# Patient Record
Sex: Female | Born: 1955 | Race: Black or African American | Hispanic: No | Marital: Single | Smoking: Former smoker
Health system: Southern US, Community
[De-identification: ages and names within clinical notes are randomized; demographics above are authoritative.]

## PROBLEM LIST (undated history)

## (undated) DIAGNOSIS — H409 Unspecified glaucoma: Secondary | ICD-10-CM

## (undated) HISTORY — PX: HERNIA REPAIR: SHX51

## (undated) HISTORY — DX: Unspecified glaucoma: H40.9

---

## 2012-03-12 ENCOUNTER — Ambulatory Visit (INDEPENDENT_AMBULATORY_CARE_PROVIDER_SITE_OTHER): Payer: BC Managed Care – PPO | Admitting: Family Medicine

## 2012-03-12 ENCOUNTER — Ambulatory Visit: Payer: BC Managed Care – PPO

## 2012-03-12 VITALS — BP 124/88 | HR 68 | Temp 97.5°F | Resp 18 | Ht 65.0 in | Wt 181.0 lb

## 2012-03-12 DIAGNOSIS — Z23 Encounter for immunization: Secondary | ICD-10-CM

## 2012-03-12 DIAGNOSIS — Z111 Encounter for screening for respiratory tuberculosis: Secondary | ICD-10-CM

## 2012-03-12 NOTE — Progress Notes (Signed)
Subjective:    Patient ID: Jean West, female    DOB: September 15, 1955, 57 y.o.   MRN: [id number] Chief Complaint  Patient presents with  . Immunizations  . 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 Moved down from [place] for family.  UTD on tetanus - got about 3-4 yrs ago in [place].  Needs flu shot and tb test.  Did think she might have had the chicken pox when she was little but not sure. Has not been vaccinated against Hep A and B.     Tuberculosis Risk Questionnaire  1. Were you born outside the [place] in one of the following parts of the world:    [place], [place], [place], [place] or [place]?  No  2. Have you traveled outside the [place] and lived for more than one month in one of the following parts of the world:  [place], [place], [place], [place] or [place]?  No  3. Do you have a compromised immune system such as from any of the following conditions:  HIV/AIDS, organ or bone marrow transplantation, diabetes, immunosuppressive   medicines (e.g. Prednisone, Remicaide), leukemia, lymphoma, cancer of the   head or neck, gastrectomy or jejunal bypass, end-stage renal disease (on   dialysis), or silicosis?  No    4. Have you ever done one of the following:    Used crack cocaine, injected illegal drugs, worked or resided in jail or prison,   worked or resided at a homeless shelter, or worked as a Research scientist (physical sciences) in   direct contact with patients?  No  5. Have you ever been exposed to anyone with infectious tuberculosis?  No   Tuberculosis Symptom Questionnaire  Do you currently have any of the following symptoms?  1. Unexplained cough lasting more than 3 weeks? No  Unexplained fever lasting more than 3 weeks. No   3. Night Sweats (sweating that leaves the bedclothes and sheets wet)   No  4. Shortness of Breath No  5. Chest Pain No  6. Unintentional weight loss  No  7. Unexplained fatigue (very tired for no reason)  No  In 1977 she went for a lung screen and then was suddenly hospitalized from pulmonary effusion - thinks she given isoniazid for poss TB exposure for 9 mos after hospitalization. She never had any symptoms.  No problems since. Has had TB skin tests several times since but are always negative.  Past Medical History  Diagnosis Date  . Glaucoma    Current Outpatient Prescriptions on File Prior to Visit  Medication Sig Dispense Refill  . levothyroxine (SYNTHROID, LEVOTHROID) 100 MCG tablet Take 100 mcg by mouth daily.       Allergies  Allergen Reactions  . Levaquin (Levofloxacin In D5w)   . Metronidazole   . Zestril (Lisinopril)      Review of Systems  Constitutional: Negative for fever, chills, diaphoresis, activity change, appetite change, fatigue and unexpected weight change.  HENT: Negative for congestion, sore throat, neck pain, neck stiffness and sinus pressure.   Respiratory: Negative for cough and shortness of breath.   Cardiovascular: Negative for chest pain.      BP 124/88  Pulse 68  Temp 97.5 F (36.4 C) (Oral)  Resp 18  Ht 5\' 5"  (1.651 m)  Wt 181 lb (82.101 kg)  BMI 30.12 kg/m2  SpO2 97% Objective:   Physical Exam  Constitutional: She is oriented to person, place, and time. She appears  well-developed and well-nourished. No distress.  HENT:  Head: Normocephalic and atraumatic.  Right Ear: External ear normal.  Left Ear: External ear normal.  Eyes: Conjunctivae normal are normal. No scleral icterus.  Neck: Normal range of motion. Neck supple. No thyromegaly present.  Cardiovascular: Normal rate, regular rhythm, normal heart sounds and intact distal pulses.   Pulmonary/Chest: Effort normal and breath sounds normal. No respiratory distress.  Musculoskeletal: She exhibits no edema.  Lymphadenopathy:    She has no cervical adenopathy.  Neurological: She is alert and oriented to person, place, and time.  Skin: Skin is warm and dry. She is not diaphoretic. No  erythema.  Psychiatric: She has a normal mood and affect. Her behavior is normal.         [facility] reading (PRIMARY) by  Dr. Clelia Croft. No acute abnormlity seen  Assessment & Plan:   1. Need for prophylactic vaccination and inoculation against influenza  Flu vaccine greater than or equal to 3yo preservative free IM  2. Screening-pulmonary TB  DG Chest 1 View  Flu shot given and pt given a letter stating she has screened negative for TB between the negative questionnaire and CXR.  If she has a h/o being treated for latent TB, then she prob shouldn't have ppd placed - though reassuring that she has had many negative ones since then per pt. 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

## 2012-03-12 NOTE — Patient Instructions (Signed)
To Whom It May Concern:  This patient was screened for tuberculosis using current guidelines from the Centers for Disease Control, and does not have tuberculosis in the communicable form.  

## 2012-04-08 ENCOUNTER — Ambulatory Visit: Payer: Self-pay | Admitting: Internal Medicine

## 2013-04-16 ENCOUNTER — Ambulatory Visit (INDEPENDENT_AMBULATORY_CARE_PROVIDER_SITE_OTHER): Payer: BC Managed Care – PPO | Admitting: Family Medicine

## 2013-04-16 VITALS — BP 125/70 | HR 60 | Temp 98.1°F | Resp 16 | Ht 65.5 in | Wt 180.0 lb

## 2013-04-16 DIAGNOSIS — H409 Unspecified glaucoma: Secondary | ICD-10-CM | POA: Insufficient documentation

## 2013-04-16 DIAGNOSIS — Z13220 Encounter for screening for lipoid disorders: Secondary | ICD-10-CM

## 2013-04-16 DIAGNOSIS — E039 Hypothyroidism, unspecified: Secondary | ICD-10-CM | POA: Insufficient documentation

## 2013-04-16 LAB — LIPID PANEL
Cholesterol: 178 mg/dL (ref 0–200)
HDL: 56 mg/dL (ref >39)
LDL CALC: 105 mg/dL — AB (ref 0–99)
TRIGLYCERIDES: 85 mg/dL (ref <150)
Total CHOL/HDL Ratio: 3.2 Ratio
VLDL: 17 mg/dL (ref 0–40)

## 2013-04-16 LAB — COMPREHENSIVE METABOLIC PANEL
ALBUMIN: 4.2 g/dL (ref 3.5–5.2)
ALK PHOS: 58 U/L (ref 39–117)
ALT: 9 U/L (ref 0–35)
AST: 15 U/L (ref 0–37)
BUN: 11 mg/dL (ref 6–23)
CALCIUM: 9.9 mg/dL (ref 8.4–10.5)
CHLORIDE: 104 meq/L (ref 96–112)
CO2: 27 mEq/L (ref 19–32)
Creat: 0.71 mg/dL (ref 0.50–1.10)
Glucose, Bld: 87 mg/dL (ref 70–99)
POTASSIUM: 3.8 meq/L (ref 3.5–5.3)
SODIUM: 139 meq/L (ref 135–145)
TOTAL PROTEIN: 7.4 g/dL (ref 6.0–8.3)
Total Bilirubin: 0.5 mg/dL (ref 0.2–1.2)

## 2013-04-16 LAB — CBC
HEMATOCRIT: 38.3 % (ref 36.0–46.0)
Hemoglobin: 12.7 g/dL (ref 12.0–15.0)
MCH: 29.9 pg (ref 26.0–34.0)
MCHC: 33.2 g/dL (ref 30.0–36.0)
MCV: 90.1 fL (ref 78.0–100.0)
PLATELETS: 213 10*3/uL (ref 150–400)
RBC: 4.25 MIL/uL (ref 3.87–5.11)
RDW: 14.2 % (ref 11.5–15.5)
WBC: 6.8 10*3/uL (ref 4.0–10.5)

## 2013-04-16 MED ORDER — LEVOTHYROXINE SODIUM 100 MCG PO TABS: 100.0000 ug | ORAL_TABLET | Freq: Every day | ORAL | Status: AC

## 2013-04-16 NOTE — Patient Instructions (Signed)
I will be in touch with your labs when they come in - we will make sure we do not need to adjust your thyroid medication

## 2013-04-16 NOTE — Progress Notes (Signed)
[facility] [address], [place] [id number] [phone]  Date:  04/16/2013   Name:  Jean West   DOB:  11/07/1955   MRN:  [id number]  PCP:  No primary provider on file.    Chief Complaint: Medication Refill   History of Present Illness:  Jean West is a 58 y.o. very pleasant female patient who presents with the following:  Here today for a TSH check and thyroid medication refill- she did eat an apple very early this am but is otherwise fasting.   She would like to go ahead and get standard screening labs done today as well She is feeling well overall, has no other complaints today  There are no active problems to display for this patient.   Past Medical History  Diagnosis Date  . Glaucoma     Past Surgical History  Procedure Laterality Date  . Hernia repair      History  Substance Use Topics  . Smoking status: Former Smoker    Quit date: 02/06/1997  . Smokeless tobacco: Not on file  . Alcohol Use: No    No family history on file.  Allergies  Allergen Reactions  . Levaquin [Levofloxacin In D5w]   . Metronidazole   . Zestril [Lisinopril]     Medication list has been reviewed and updated.  Current Outpatient Prescriptions on File Prior to Visit  Medication Sig Dispense Refill  . brimonidine (ALPHAGAN P) 0.1 % SOLN 2 drops in both eyes daily      . dorzolamide (TRUSOPT) 2 % ophthalmic solution 1 drop 3 (three) times daily.      Marland Kitchen. levothyroxine (SYNTHROID, LEVOTHROID) 100 MCG tablet Take 100 mcg by mouth daily.      Marland Kitchen. latanoprost (XALATAN) 0.005 % ophthalmic solution 1 drop at bedtime.       No current facility-administered medications on file prior to visit.    Review of Systems:  As per HPI- otherwise negative.   Physical Examination: Filed Vitals:   04/16/13 1235  BP: 125/70  Pulse: 60  Temp: 98.1 F (36.7 C)  Resp: 16   Filed Vitals:   04/16/13 1235  Height: 5' 5.5" (1.664 m)  Weight: 180 lb (81.647 kg)    Body mass index is 29.49 kg/(m^2). Ideal Body Weight: Weight in (lb) to have BMI = 25: 152.2  GEN: WDWN, NAD, Non-toxic, A & O x 3, looks well HEENT: Atraumatic, Normocephalic. Neck supple. No masses, No LAD. Ears and Nose: No external deformity. CV: RRR, No M/G/R. No JVD. No thrill. No extra heart sounds. PULM: CTA B, no wheezes, crackles, rhonchi. No retractions. No resp. distress. No accessory muscle use. EXTR: No c/c/e NEURO Normal gait.  PSYCH: Normally interactive. Conversant. Not depressed or anxious appearing.  Calm demeanor.    Assessment and Plan: Unspecified hypothyroidism - Plan: TSH, levothyroxine (SYNTHROID, LEVOTHROID) 100 MCG tablet  Screening for hyperlipidemia - Plan: CBC, Comprehensive metabolic panel, Lipid panel  Glaucoma  Refilled her thyroid medication and checked labs today Will plan further follow- up pending labs.  See patient instructions for more details.     Signed Abbe AmsterdamJessica Copland, MD

## 2013-04-17 ENCOUNTER — Encounter: Payer: Self-pay | Admitting: Family Medicine

## 2013-04-17 LAB — TSH: TSH: 0.388 u[IU]/mL (ref 0.350–4.500)

## 2013-12-21 IMAGING — CR DG CHEST 1V
1 series · 1 of 1 positions shown · non-contrast
Comparison: None.

CLINICAL DATA: TB screening.  History of a pleural effusion.

CHEST - 1 VIEW

[PA]
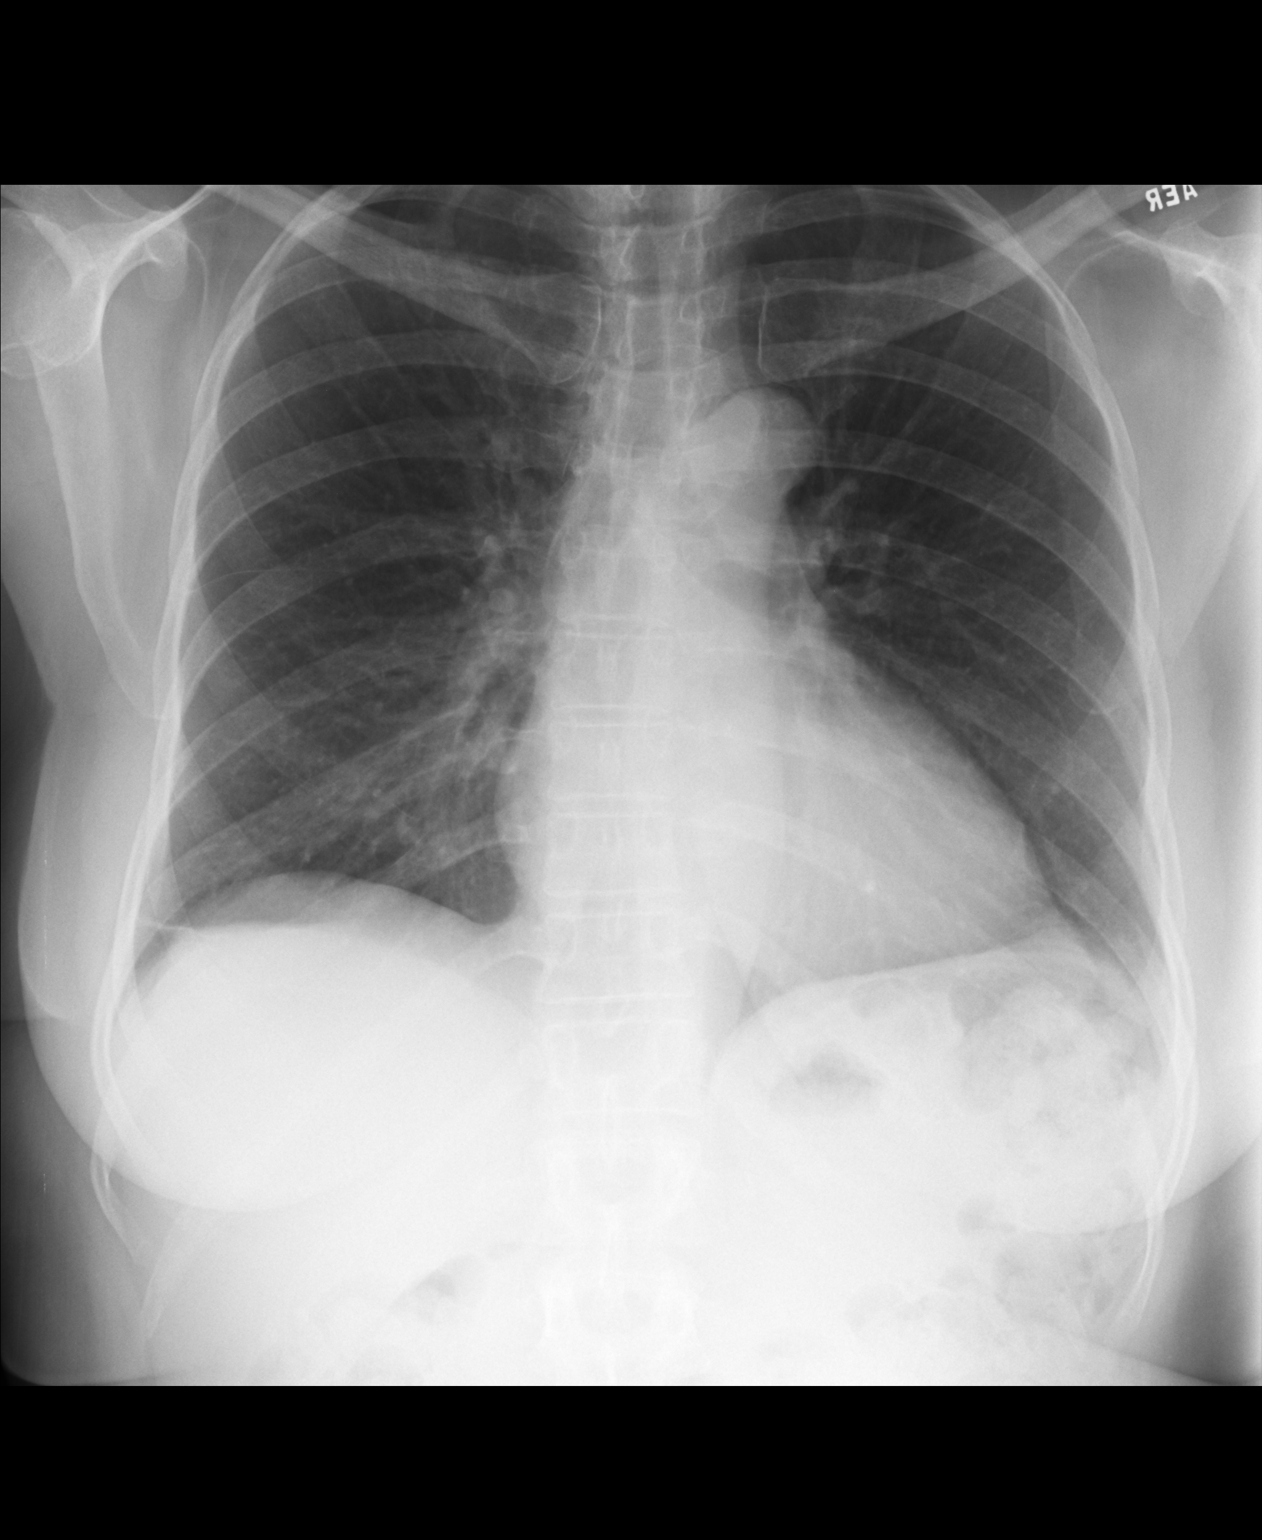

[1 of 1 positions shown; findings below may reference images not displayed]

FINDINGS: There is mild linear subsegmental atelectasis or scarring
at the right lateral lung base.  The lungs otherwise clear.  The
heart, mediastinum and hila are unremarkable.  The bony thorax is
intact.  No pleural effusion is evident.
IMPRESSION: No active disease of the chest.
